# Patient Record
Sex: Female | Born: 1950 | Race: White | Hispanic: No | Marital: Married | Smoking: Never smoker
Health system: Southern US, Community
[De-identification: ages and names within clinical notes are randomized; demographics above are authoritative.]

## PROBLEM LIST (undated history)

## (undated) DIAGNOSIS — E079 Disorder of thyroid, unspecified: Secondary | ICD-10-CM

## (undated) HISTORY — PX: BREAST SURGERY: SHX581

## (undated) HISTORY — DX: Disorder of thyroid, unspecified: E07.9

---

## 1999-05-26 ENCOUNTER — Other Ambulatory Visit: Admission: RE | Admit: 1999-05-26 | Discharge: 1999-05-26 | Payer: Self-pay | Admitting: *Deleted

## 2003-03-20 ENCOUNTER — Ambulatory Visit (HOSPITAL_COMMUNITY): Admission: RE | Admit: 2003-03-20 | Discharge: 2003-03-20 | Payer: Self-pay | Admitting: *Deleted

## 2004-02-12 ENCOUNTER — Other Ambulatory Visit: Admission: RE | Admit: 2004-02-12 | Discharge: 2004-02-12 | Payer: Self-pay | Admitting: *Deleted

## 2004-10-12 ENCOUNTER — Emergency Department (HOSPITAL_COMMUNITY): Admission: EM | Admit: 2004-10-12 | Discharge: 2004-10-12 | Payer: Self-pay | Admitting: Emergency Medicine

## 2005-02-25 ENCOUNTER — Other Ambulatory Visit: Admission: RE | Admit: 2005-02-25 | Discharge: 2005-02-25 | Payer: Self-pay | Admitting: Gynecology

## 2006-03-06 ENCOUNTER — Other Ambulatory Visit: Admission: RE | Admit: 2006-03-06 | Discharge: 2006-03-06 | Payer: Self-pay | Admitting: Obstetrics and Gynecology

## 2006-03-22 ENCOUNTER — Ambulatory Visit (HOSPITAL_COMMUNITY): Admission: RE | Admit: 2006-03-22 | Discharge: 2006-03-22 | Payer: Self-pay | Admitting: Obstetrics and Gynecology

## 2006-06-07 ENCOUNTER — Emergency Department (HOSPITAL_COMMUNITY): Admission: EM | Admit: 2006-06-07 | Discharge: 2006-06-07 | Payer: Self-pay | Admitting: Emergency Medicine

## 2007-03-08 ENCOUNTER — Other Ambulatory Visit: Admission: RE | Admit: 2007-03-08 | Discharge: 2007-03-08 | Payer: Self-pay | Admitting: Obstetrics and Gynecology

## 2007-05-02 ENCOUNTER — Emergency Department (HOSPITAL_COMMUNITY): Admission: EM | Admit: 2007-05-02 | Discharge: 2007-05-02 | Payer: Self-pay | Admitting: *Deleted

## 2007-06-21 HISTORY — PX: EYE SURGERY: SHX253

## 2007-06-22 ENCOUNTER — Ambulatory Visit (HOSPITAL_COMMUNITY): Admission: RE | Admit: 2007-06-22 | Discharge: 2007-06-22 | Payer: Self-pay | Admitting: Obstetrics and Gynecology

## 2008-03-17 ENCOUNTER — Encounter: Payer: Self-pay | Admitting: Obstetrics and Gynecology

## 2008-03-17 ENCOUNTER — Ambulatory Visit: Payer: Self-pay | Admitting: Obstetrics and Gynecology

## 2008-03-17 ENCOUNTER — Other Ambulatory Visit: Admission: RE | Admit: 2008-03-17 | Discharge: 2008-03-17 | Payer: Self-pay | Admitting: Obstetrics and Gynecology

## 2008-04-08 ENCOUNTER — Ambulatory Visit: Payer: Self-pay | Admitting: Obstetrics and Gynecology

## 2008-08-14 IMAGING — CR DG CHEST 1V PORT
1 series · 1 of 1 positions shown · non-contrast
Comparison: None available.

CLINICAL DATA: Chest pressure.  
 PORTABLE CHEST - 1 VIEW 05/02/07 AT 7272 HOURS:

[view not recorded]
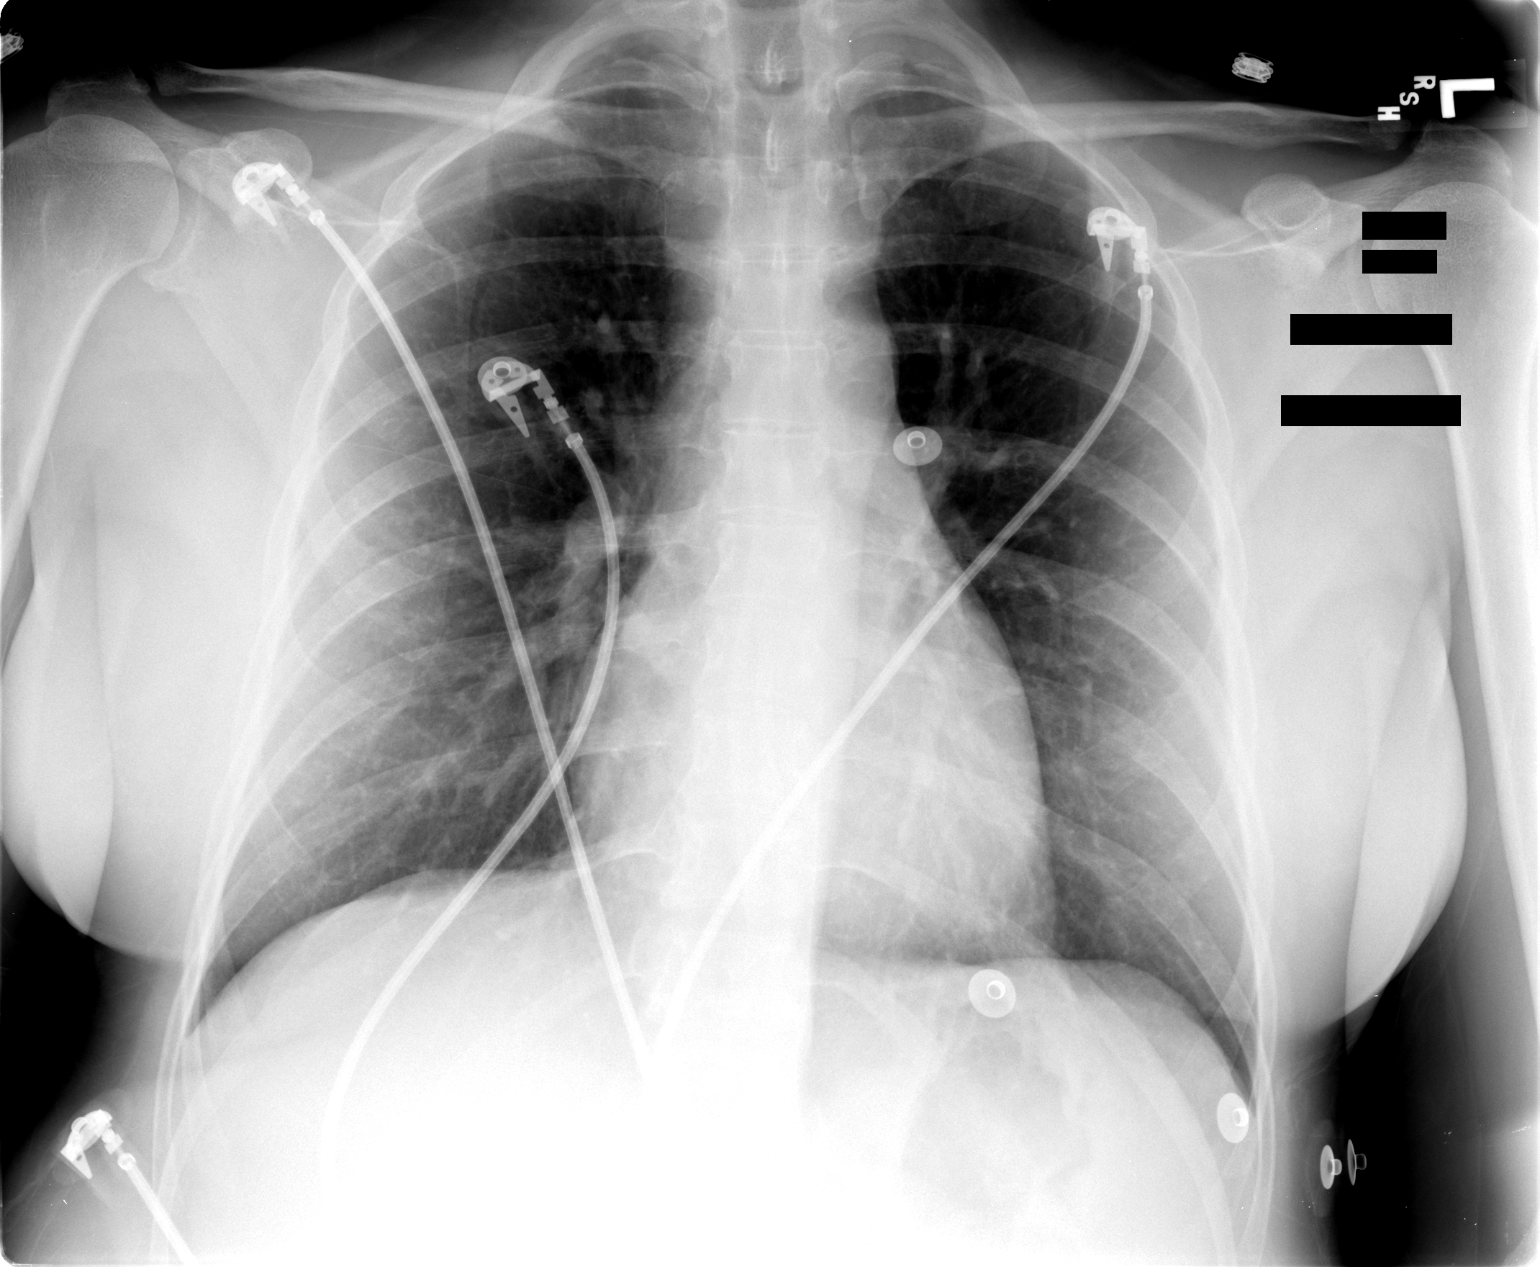

[1 of 1 positions shown; findings below may reference images not displayed]

FINDINGS: The heart size and mediastinal contours are within normal limits.  Both lungs are clear.
IMPRESSION: No acute findings.

## 2008-09-30 ENCOUNTER — Ambulatory Visit (HOSPITAL_COMMUNITY): Admission: RE | Admit: 2008-09-30 | Discharge: 2008-09-30 | Payer: Self-pay | Admitting: Obstetrics and Gynecology

## 2009-03-23 ENCOUNTER — Ambulatory Visit: Payer: Self-pay | Admitting: Obstetrics and Gynecology

## 2009-03-23 ENCOUNTER — Encounter: Payer: Self-pay | Admitting: Obstetrics and Gynecology

## 2009-03-23 ENCOUNTER — Other Ambulatory Visit: Admission: RE | Admit: 2009-03-23 | Discharge: 2009-03-23 | Payer: Self-pay | Admitting: Obstetrics and Gynecology

## 2009-10-01 ENCOUNTER — Ambulatory Visit: Payer: Self-pay | Admitting: Obstetrics and Gynecology

## 2009-10-01 ENCOUNTER — Ambulatory Visit (HOSPITAL_COMMUNITY): Admission: RE | Admit: 2009-10-01 | Discharge: 2009-10-01 | Payer: Self-pay | Admitting: Obstetrics and Gynecology

## 2010-03-30 ENCOUNTER — Ambulatory Visit: Payer: Self-pay | Admitting: Obstetrics and Gynecology

## 2010-03-30 ENCOUNTER — Other Ambulatory Visit: Admission: RE | Admit: 2010-03-30 | Discharge: 2010-03-30 | Payer: Self-pay | Admitting: Obstetrics and Gynecology

## 2010-07-14 ENCOUNTER — Ambulatory Visit
Admission: RE | Admit: 2010-07-14 | Discharge: 2010-07-14 | Payer: Self-pay | Source: Home / Self Care | Attending: Obstetrics and Gynecology | Admitting: Obstetrics and Gynecology

## 2010-09-13 ENCOUNTER — Other Ambulatory Visit: Payer: Self-pay | Admitting: Obstetrics and Gynecology

## 2010-09-13 DIAGNOSIS — Z1231 Encounter for screening mammogram for malignant neoplasm of breast: Secondary | ICD-10-CM

## 2010-10-05 ENCOUNTER — Ambulatory Visit (HOSPITAL_COMMUNITY)
Admission: RE | Admit: 2010-10-05 | Discharge: 2010-10-05 | Disposition: A | Discharge status: Home / Self Care | Payer: BC Managed Care – PPO | Source: Ambulatory Visit | Attending: Obstetrics and Gynecology | Admitting: Obstetrics and Gynecology

## 2010-10-05 DIAGNOSIS — Z1231 Encounter for screening mammogram for malignant neoplasm of breast: Secondary | ICD-10-CM | POA: Insufficient documentation

## 2010-11-05 NOTE — Op Note (Signed)
   NAME:  Shannon Reed, Shannon Reed                 ACCOUNT NO.:  [id number]   MEDICAL RECORD NO.:  [id number]                   PATIENT TYPE:  AMB   LOCATION:  ENDO                                 FACILITY:  [facility]   PHYSICIAN:  James L. Malon Kindle., M.D.          DATE OF BIRTH:  06-Nov-1950   DATE OF PROCEDURE:  03/20/2003  DATE OF DISCHARGE:                                 OPERATIVE REPORT   PROCEDURE:  Colonoscopy.   MEDICATIONS:  Fentanyl 90 mcg, Versed 9 mg IV.   INDICATIONS FOR PROCEDURE:  The patient with a strong family history of  colon cancer. Her father died of colon cancer.   DESCRIPTION OF PROCEDURE:  The procedure had been explained to the patient  and consent obtained.  With the patient in the left lateral decubitus  position, the Olympus pediatric video colonoscope was inserted and advanced.  The prep was excellent.  The cecum was reached with the patient in the  supine position. The ileocecal valve and appendiceal orifice were seen.  The  scope was withdrawn.  The cecum, ascending colon, transverse colon,  descending and sigmoid colon were seen well.  No polyps or other lesions  were seen. The scope was withdrawn.  The patient tolerated the procedure  well.   ASSESSMENT:  No evidence of colon polyps.   PLAN:  Will recommend yearly stool checks and repeat colonoscopy in five  years.                                               James L. Malon Kindle., M.D.    Waldron Session  D:  03/20/2003  T:  03/20/2003  Job:  [id number]   cc:   Al Decant. Janey Greaser, MD  [address]  [place]  [place] [address]  Fax: [phone]

## 2011-03-29 ENCOUNTER — Encounter: Payer: Self-pay | Admitting: Gynecology

## 2011-03-29 DIAGNOSIS — E079 Disorder of thyroid, unspecified: Secondary | ICD-10-CM | POA: Insufficient documentation

## 2011-03-29 LAB — POCT CARDIAC MARKERS
CKMB, poc: <1 — ABNORMAL LOW
CKMB, poc: <1 — ABNORMAL LOW
Myoglobin, poc: 49.4
Myoglobin, poc: 65.2
Operator id: 285841
Troponin i, poc: <0.05

## 2011-03-29 LAB — I-STAT 8, (EC8 V) (CONVERTED LAB)
Bicarbonate: 24
Chloride: 110
HCT: 41
Hemoglobin: 13.9
Operator id: 285841
TCO2: 25
pCO2, Ven: 36.3 — ABNORMAL LOW
pH, Ven: 7.429 — ABNORMAL HIGH

## 2011-03-29 LAB — CBC
HCT: 39.6
Hemoglobin: 13.3
MCHC: 33.5
Platelets: 186
RDW: 13.7

## 2011-03-29 LAB — DIFFERENTIAL
Basophils Absolute: 0
Basophils Relative: 0
Eosinophils Relative: 0
Lymphocytes Relative: 21
Monocytes Absolute: 0.4
Neutro Abs: 4.9

## 2011-03-29 LAB — POCT I-STAT CREATININE: Creatinine, Ser: 0.8

## 2011-04-05 ENCOUNTER — Other Ambulatory Visit (HOSPITAL_COMMUNITY)
Admission: RE | Admit: 2011-04-05 | Discharge: 2011-04-05 | Disposition: A | Discharge status: Home / Self Care | Payer: BC Managed Care – PPO | Source: Ambulatory Visit | Attending: Obstetrics and Gynecology | Admitting: Obstetrics and Gynecology

## 2011-04-05 ENCOUNTER — Ambulatory Visit (INDEPENDENT_AMBULATORY_CARE_PROVIDER_SITE_OTHER): Payer: BC Managed Care – PPO | Admitting: Obstetrics and Gynecology

## 2011-04-05 ENCOUNTER — Encounter: Payer: Self-pay | Admitting: Obstetrics and Gynecology

## 2011-04-05 VITALS — BP 124/74 | Ht 66.0 in | Wt 184.0 lb

## 2011-04-05 DIAGNOSIS — N390 Urinary tract infection, site not specified: Secondary | ICD-10-CM

## 2011-04-05 DIAGNOSIS — Z01419 Encounter for gynecological examination (general) (routine) without abnormal findings: Secondary | ICD-10-CM

## 2011-04-05 DIAGNOSIS — Z833 Family history of diabetes mellitus: Secondary | ICD-10-CM

## 2011-04-05 DIAGNOSIS — R82998 Other abnormal findings in urine: Secondary | ICD-10-CM

## 2011-04-05 DIAGNOSIS — Z13220 Encounter for screening for lipoid disorders: Secondary | ICD-10-CM

## 2011-04-05 DIAGNOSIS — N952 Postmenopausal atrophic vaginitis: Secondary | ICD-10-CM

## 2011-04-05 DIAGNOSIS — E039 Hypothyroidism, unspecified: Secondary | ICD-10-CM

## 2011-04-05 MED ORDER — LEVOTHYROXINE SODIUM 125 MCG PO TABS: 125.0000 ug | ORAL_TABLET | Freq: Every day | ORAL | Status: DC

## 2011-04-05 NOTE — Progress Notes (Signed)
Patient came to see me today for an annual GYN exam. She remains asymptomatic on her Synthroid. She is up-to-date on mammograms and bone densities. She may be due for another colonoscopy. She has some menopausal symptoms including vaginal dryness but feels that she's okay without treatment. She is having no vaginal bleeding or pelvic pain.  ROS: 12 system review negative except as noted above.  Physical examination: Kennon Portela present HEENT within normal limits. Neck: Thyroid not large. No masses. Supraclavicular nodes: not enlarged. Breasts: Examined in both sitting midline position. No skin changes and no masses. Abdomen: Soft no guarding rebound or masses or hernia. Pelvic: External: Within normal limits. BUS: Within normal limits. Vaginal:within normal limits. Good estrogen effect. No evidence of cystocele rectocele or enterocele. Cervix: clean. Uterus: Normal size and shape. Adnexa: No masses. Rectovaginal exam: Confirmatory and negative. Extremities: Within normal limits.  Assessment: #1. Hypothyroidism #2. Mild menopausal symptoms  Plan: Continue Synthroid. TSH checked. Continue yearly mammograms. Patient to check with GI M.D. To see when she is due for colonoscopy. She will tell him that her father died of abdominal cancer of unknown etiology.

## 2011-04-12 ENCOUNTER — Telehealth: Payer: Self-pay

## 2011-04-12 NOTE — Telephone Encounter (Signed)
error 

## 2011-04-13 ENCOUNTER — Other Ambulatory Visit: Payer: Self-pay | Admitting: Obstetrics and Gynecology

## 2011-04-13 DIAGNOSIS — E7800 Pure hypercholesterolemia, unspecified: Secondary | ICD-10-CM

## 2011-04-13 DIAGNOSIS — E785 Hyperlipidemia, unspecified: Secondary | ICD-10-CM

## 2011-04-13 DIAGNOSIS — D696 Thrombocytopenia, unspecified: Secondary | ICD-10-CM

## 2011-04-19 ENCOUNTER — Other Ambulatory Visit: Payer: BC Managed Care – PPO

## 2011-04-26 ENCOUNTER — Other Ambulatory Visit: Payer: BC Managed Care – PPO

## 2011-05-03 ENCOUNTER — Other Ambulatory Visit (INDEPENDENT_AMBULATORY_CARE_PROVIDER_SITE_OTHER): Payer: BC Managed Care – PPO | Admitting: *Deleted

## 2011-05-03 DIAGNOSIS — E7800 Pure hypercholesterolemia, unspecified: Secondary | ICD-10-CM

## 2011-05-03 DIAGNOSIS — D696 Thrombocytopenia, unspecified: Secondary | ICD-10-CM

## 2011-09-08 ENCOUNTER — Other Ambulatory Visit: Payer: Self-pay | Admitting: Obstetrics and Gynecology

## 2011-09-08 DIAGNOSIS — Z1231 Encounter for screening mammogram for malignant neoplasm of breast: Secondary | ICD-10-CM

## 2011-10-06 ENCOUNTER — Ambulatory Visit (HOSPITAL_COMMUNITY)
Admission: RE | Admit: 2011-10-06 | Discharge: 2011-10-06 | Disposition: A | Discharge status: Home / Self Care | Payer: BC Managed Care – PPO | Source: Ambulatory Visit | Attending: Obstetrics and Gynecology | Admitting: Obstetrics and Gynecology

## 2011-10-06 DIAGNOSIS — Z1231 Encounter for screening mammogram for malignant neoplasm of breast: Secondary | ICD-10-CM | POA: Insufficient documentation

## 2011-10-11 ENCOUNTER — Other Ambulatory Visit: Payer: Self-pay | Admitting: *Deleted

## 2011-10-11 DIAGNOSIS — N63 Unspecified lump in unspecified breast: Secondary | ICD-10-CM

## 2011-10-17 ENCOUNTER — Other Ambulatory Visit: Payer: Self-pay | Admitting: Internal Medicine

## 2011-10-18 ENCOUNTER — Ambulatory Visit
Admission: RE | Admit: 2011-10-18 | Discharge: 2011-10-18 | Disposition: A | Discharge status: Home / Self Care | Payer: BC Managed Care – PPO | Source: Ambulatory Visit | Attending: Obstetrics and Gynecology | Admitting: Obstetrics and Gynecology

## 2011-10-18 DIAGNOSIS — N63 Unspecified lump in unspecified breast: Secondary | ICD-10-CM

## 2012-04-05 ENCOUNTER — Encounter: Payer: Self-pay | Admitting: Obstetrics and Gynecology

## 2012-04-05 ENCOUNTER — Ambulatory Visit (INDEPENDENT_AMBULATORY_CARE_PROVIDER_SITE_OTHER): Payer: BC Managed Care – PPO | Admitting: Obstetrics and Gynecology

## 2012-04-05 VITALS — BP 120/76 | Ht 65.5 in | Wt 186.0 lb

## 2012-04-05 DIAGNOSIS — E039 Hypothyroidism, unspecified: Secondary | ICD-10-CM

## 2012-04-05 DIAGNOSIS — Z01419 Encounter for gynecological examination (general) (routine) without abnormal findings: Secondary | ICD-10-CM

## 2012-04-05 LAB — TSH: TSH: 2.097 u[IU]/mL (ref 0.350–4.500)

## 2012-04-05 MED ORDER — LEVOTHYROXINE SODIUM 125 MCG PO TABS: 125.0000 ug | ORAL_TABLET | Freq: Every day | ORAL | Status: DC

## 2012-04-05 NOTE — Patient Instructions (Signed)
Continue yearly mammograms 

## 2012-04-05 NOTE — Progress Notes (Signed)
Patient came to see me today for her annual GYN exam. She is having no vaginal bleeding. She is having no pelvic pain. She does have some vaginal dryness with intercourse but does not need medication. She is doing well without HRT. She was recalled this year after her mammogram but all was okay. She's had 2 normal bone densities. She has always had normal Pap smears. Her last Pap smear was 2012. We are treating her for hypothyroidism. She is asymptomatic on her current Synthroid dose.  Physical examination:Shannon Reed present. HEENT within normal limits. Neck: Thyroid not large. No masses. Supraclavicular nodes: not enlarged. Breasts: Examined in both sitting and lying  position. No skin changes and no masses. Abdomen: Soft no guarding rebound or masses or hernia. Pelvic: External: Within normal limits. BUS: Within normal limits. Vaginal:within normal limits. Fair  estrogen effect. No evidence of cystocele rectocele or enterocele. Cervix: clean. Uterus: Normal size and shape. Adnexa: No masses. Rectovaginal exam: Confirmatory and negative. Extremities: Within normal limits.  Assessment: #1. Hypothyroidism #2. Mild atrophic vaginitis  Plan: TSH checked. Continue Synthroid. Continue yearly mammograms. It has been 10 years since her last colonoscopy. Strongly urged to get a colonoscopy. Pap Not done.The new Pap smear guidelines were discussed with the patient.

## 2012-04-06 LAB — URINALYSIS W MICROSCOPIC + REFLEX CULTURE
Bacteria, UA: NONE SEEN
Casts: NONE SEEN
Glucose, UA: NEGATIVE mg/dL
Hgb urine dipstick: NEGATIVE
Ketones, ur: NEGATIVE mg/dL
pH: 5.5 (ref 5.0–8.0)

## 2012-04-10 NOTE — Progress Notes (Signed)
Blood was not drawn for CBC. Kennon Portela will call patient to return

## 2012-04-11 ENCOUNTER — Encounter: Payer: Self-pay | Admitting: Obstetrics and Gynecology

## 2012-04-16 ENCOUNTER — Other Ambulatory Visit: Payer: BC Managed Care – PPO

## 2012-04-17 LAB — CBC WITH DIFFERENTIAL/PLATELET
Basophils Absolute: 0 10*3/uL (ref 0.0–0.1)
Basophils Relative: 1 % (ref 0–1)
MCHC: 34.2 g/dL (ref 30.0–36.0)
Monocytes Absolute: 0.6 10*3/uL (ref 0.1–1.0)
Neutro Abs: 5.9 10*3/uL (ref 1.7–7.7)
Neutrophils Relative %: 68 % (ref 43–77)
Platelets: 203 10*3/uL (ref 150–400)
RDW: 13.9 % (ref 11.5–15.5)

## 2013-04-16 ENCOUNTER — Encounter: Payer: Self-pay | Admitting: Gynecology

## 2013-04-16 ENCOUNTER — Telehealth: Payer: Self-pay | Admitting: *Deleted

## 2013-04-16 ENCOUNTER — Ambulatory Visit (INDEPENDENT_AMBULATORY_CARE_PROVIDER_SITE_OTHER): Payer: BC Managed Care – PPO | Admitting: Gynecology

## 2013-04-16 VITALS — BP 122/72 | Ht 65.0 in | Wt 172.0 lb

## 2013-04-16 DIAGNOSIS — E039 Hypothyroidism, unspecified: Secondary | ICD-10-CM

## 2013-04-16 DIAGNOSIS — Z01419 Encounter for gynecological examination (general) (routine) without abnormal findings: Secondary | ICD-10-CM

## 2013-04-16 DIAGNOSIS — N952 Postmenopausal atrophic vaginitis: Secondary | ICD-10-CM

## 2013-04-16 LAB — COMPREHENSIVE METABOLIC PANEL
ALT: 8 U/L (ref 0–35)
AST: 16 U/L (ref 0–37)
Albumin: 4.6 g/dL (ref 3.5–5.2)
Alkaline Phosphatase: 131 U/L — ABNORMAL HIGH (ref 39–117)
BUN: 12 mg/dL (ref 6–23)
Calcium: 9.8 mg/dL (ref 8.4–10.5)
Chloride: 104 mEq/L (ref 96–112)
Potassium: 4.2 mEq/L (ref 3.5–5.3)
Total Protein: 7.1 g/dL (ref 6.0–8.3)

## 2013-04-16 LAB — LIPID PANEL
HDL: 69 mg/dL (ref >39)
LDL Cholesterol: 116 mg/dL — ABNORMAL HIGH (ref 0–99)
VLDL: 22 mg/dL (ref 0–40)

## 2013-04-16 LAB — URINALYSIS W MICROSCOPIC + REFLEX CULTURE
Casts: NONE SEEN
Glucose, UA: NEGATIVE mg/dL
Hgb urine dipstick: NEGATIVE
Leukocytes, UA: NEGATIVE
Nitrite: NEGATIVE
RBC / HPF: NONE SEEN RBC/hpf (ref <3)
Specific Gravity, Urine: 1.009 (ref 1.005–1.030)
Squamous Epithelial / LPF: NONE SEEN
WBC, UA: NONE SEEN WBC/hpf (ref <3)
pH: 6 (ref 5.0–8.0)

## 2013-04-16 LAB — CBC WITH DIFFERENTIAL/PLATELET
Basophils Absolute: 0 10*3/uL (ref 0.0–0.1)
Basophils Relative: 0 % (ref 0–1)
Eosinophils Absolute: 0 10*3/uL (ref 0.0–0.7)
Eosinophils Relative: 0 % (ref 0–5)
HCT: 39.7 % (ref 36.0–46.0)
Hemoglobin: 13.5 g/dL (ref 12.0–15.0)
Lymphocytes Relative: 20 % (ref 12–46)
Lymphs Abs: 1.4 10*3/uL (ref 0.7–4.0)
MCH: 29.2 pg (ref 26.0–34.0)
MCHC: 34 g/dL (ref 30.0–36.0)
MCV: 85.9 fL (ref 78.0–100.0)
Monocytes Absolute: 0.5 10*3/uL (ref 0.1–1.0)
Monocytes Relative: 7 % (ref 3–12)
Neutro Abs: 4.9 10*3/uL (ref 1.7–7.7)
Neutrophils Relative %: 73 % (ref 43–77)
Platelets: 175 10*3/uL (ref 150–400)
RBC: 4.62 MIL/uL (ref 3.87–5.11)
RDW: 14.3 % (ref 11.5–15.5)
WBC: 6.8 10*3/uL (ref 4.0–10.5)

## 2013-04-16 LAB — TSH: TSH: 1.891 u[IU]/mL (ref 0.350–4.500)

## 2013-04-16 MED ORDER — LEVOTHYROXINE SODIUM 125 MCG PO TABS: 125.0000 ug | ORAL_TABLET | Freq: Every day | ORAL | Status: AC

## 2013-04-16 NOTE — Telephone Encounter (Signed)
Pharmacy called to clarify if pt takes brand or generic synthroid 125 mcg daily, I called back and left on voicemail that per notes pt take synthroid brand, this is what pt takes in past.

## 2013-04-16 NOTE — Progress Notes (Signed)
Shannon Reed 05-06-1951 [id number]        62 y.o.  [id number] for annual exam.  Former patient of Dr. Eda Paschal. Several issues noted below.  Past medical history,surgical history, medications, allergies, family history and social history were all reviewed and documented in the EPIC chart.  ROS:  Performed and pertinent positives and negatives are included in the history, assessment and plan .  Exam: Kim assistant Filed Vitals:   04/16/13 0855  BP: 122/72  Height: 5\' 5"  (1.651 m)  Weight: 172 lb (78.019 kg)   General appearance  Normal Skin grossly normal Head/Neck normal with no cervical or supraclavicular adenopathy thyroid normal Lungs  clear Cardiac RR, without RMG Abdominal  soft, nontender, without masses, organomegaly or hernia Breasts  examined lying and sitting without masses, retractions, discharge or axillary adenopathy. Pelvic  Ext/BUS/vagina  normal with atrophic changes.  Cervix  normal with atrophic changes  Uterus  anteverted, normal size, shape and contour, midline and mobile nontender   Adnexa  Without masses or tenderness    Anus and perineum  normal   Rectovaginal  normal sphincter tone without palpated masses or tenderness.    Assessment/Plan:  62 y.o. [id number] female for annual exam.   1. Postmenopausal. Patient without significant hot flashes, night sweats, vaginal dryness or dyspareunia. No bleeding. Patient knows to report any bleeding. 2. Hypothyroid. Dr. Eda Paschal prescribes her Synthroid and I checked a TSH today and refilled her Synthroid x1 year. 3. DEXA 2009 normal as was her 2006 DEXA. Plan repeat in several years. Increase calcium and vitamin D reviewed. Check vitamin D level today. 4. Mammography 2013. Recommended scheduling now and she agrees to do so. She did have a call back with her last mammogram due to a shadow and I recommended she get a 3-D mammogram this year. SBE monthly reviewed. 5. Pap smear 2012. No Pap smear done today. No  history of abnormal Pap smears. Plan repeat next year a 3 year interval 6. Colonoscopy 8-9 years ago. Patient's going to call and schedule when due. 7. Health maintenance. Baseline CBC comprehensive metabolic panel lipid profile urinalysis TSH and vitamin D ordered. Followup one year, sooner as needed.  Note: This document was prepared with digital dictation and possible smart phrase technology. Any transcriptional errors that result from this process are unintentional.   Dara Lords MD, 9:31 AM 04/16/2013

## 2013-04-16 NOTE — Patient Instructions (Signed)
Schedule your 3-D mammogram. Followup in one year for annual exam.

## 2013-04-22 ENCOUNTER — Other Ambulatory Visit: Payer: Self-pay | Admitting: Gynecology

## 2013-04-22 DIAGNOSIS — E7800 Pure hypercholesterolemia, unspecified: Secondary | ICD-10-CM

## 2013-04-25 ENCOUNTER — Other Ambulatory Visit: Payer: BC Managed Care – PPO

## 2013-04-25 DIAGNOSIS — E7800 Pure hypercholesterolemia, unspecified: Secondary | ICD-10-CM

## 2013-04-25 LAB — LIPID PANEL
HDL: 70 mg/dL (ref >39)
LDL Cholesterol: 127 mg/dL — ABNORMAL HIGH (ref 0–99)
Total CHOL/HDL Ratio: 3 Ratio
Triglycerides: 78 mg/dL (ref <150)

## 2013-04-25 LAB — ALKALINE PHOSPHATASE: Alkaline Phosphatase: 137 U/L — ABNORMAL HIGH (ref 39–117)

## 2013-05-30 ENCOUNTER — Other Ambulatory Visit: Payer: Self-pay

## 2013-05-30 DIAGNOSIS — Z1231 Encounter for screening mammogram for malignant neoplasm of breast: Secondary | ICD-10-CM

## 2013-07-04 ENCOUNTER — Ambulatory Visit
Admission: RE | Admit: 2013-07-04 | Discharge: 2013-07-04 | Disposition: A | Discharge status: Home / Self Care | Payer: BC Managed Care – PPO | Source: Ambulatory Visit

## 2013-07-04 DIAGNOSIS — Z1231 Encounter for screening mammogram for malignant neoplasm of breast: Secondary | ICD-10-CM

## 2013-07-05 ENCOUNTER — Other Ambulatory Visit: Payer: Self-pay | Admitting: Gynecology

## 2013-07-05 DIAGNOSIS — R928 Other abnormal and inconclusive findings on diagnostic imaging of breast: Secondary | ICD-10-CM

## 2013-07-12 ENCOUNTER — Ambulatory Visit
Admission: RE | Admit: 2013-07-12 | Discharge: 2013-07-12 | Disposition: A | Discharge status: Home / Self Care | Payer: BC Managed Care – PPO | Source: Ambulatory Visit | Attending: Gynecology | Admitting: Gynecology

## 2013-07-12 ENCOUNTER — Other Ambulatory Visit: Payer: Self-pay | Admitting: Gynecology

## 2013-07-12 DIAGNOSIS — R928 Other abnormal and inconclusive findings on diagnostic imaging of breast: Secondary | ICD-10-CM

## 2013-07-12 DIAGNOSIS — N6320 Unspecified lump in the left breast, unspecified quadrant: Secondary | ICD-10-CM

## 2013-07-15 ENCOUNTER — Ambulatory Visit
Admission: RE | Admit: 2013-07-15 | Discharge: 2013-07-15 | Disposition: A | Discharge status: Home / Self Care | Payer: BC Managed Care – PPO | Source: Ambulatory Visit | Attending: Gynecology | Admitting: Gynecology

## 2013-07-15 ENCOUNTER — Other Ambulatory Visit: Payer: Self-pay | Admitting: Gynecology

## 2013-07-15 DIAGNOSIS — N6320 Unspecified lump in the left breast, unspecified quadrant: Secondary | ICD-10-CM

## 2013-07-17 ENCOUNTER — Other Ambulatory Visit: Payer: Self-pay | Admitting: Gynecology

## 2013-07-17 ENCOUNTER — Telehealth: Payer: Self-pay

## 2013-07-17 DIAGNOSIS — C50919 Malignant neoplasm of unspecified site of unspecified female breast: Secondary | ICD-10-CM

## 2013-07-17 NOTE — Telephone Encounter (Signed)
Patient called stating she has been diagnosed with breast cancer.  She wants to go to either [facility] or [facility] and wants the best doctor, one of the chiefs or heads.  She needs you to refer her there and advice regarding which one to go to and perhaps a doctor name.    I spoke with Juliann Pulse at [facility]. 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 She only found out this morning.   Patient will be at home awaiting our return call [phone].

## 2013-07-17 NOTE — Telephone Encounter (Signed)
I called the patient in followup. I reviewed with her that I felt that [place] had a very good team of surgeons, medical oncologist and radiation oncologist to treat breast cancer. I had the utmost confidence in them. I have no specific connections with [facility] or [facility]. I would be happy to refer her to these centers but had no specific physician to send her to. She is already scheduled in the breast cancer conference at [facility] by the radiology department and has this appointment scheduled for the first week of February. She is now comfortable with seeing them and will keep that appointment and followup with them for ongoing evaluation and treatment. I encouraged her if she has any questions or concerns/confusion during her treatment to call me.

## 2013-07-19 ENCOUNTER — Other Ambulatory Visit: Payer: Self-pay | Admitting: *Deleted

## 2013-07-19 ENCOUNTER — Telehealth: Payer: Self-pay | Admitting: *Deleted

## 2013-07-19 ENCOUNTER — Telehealth: Payer: Self-pay

## 2013-07-19 DIAGNOSIS — C50919 Malignant neoplasm of unspecified site of unspecified female breast: Secondary | ICD-10-CM

## 2013-07-19 DIAGNOSIS — C50212 Malignant neoplasm of upper-inner quadrant of left female breast: Secondary | ICD-10-CM

## 2013-07-19 NOTE — Telephone Encounter (Signed)
What I would suggest is to have her visit here at [facility] and then if she is still interested in a second opinion option 1 is to ask the oncologist she sees who they would recommend at [facility] for a second opinion or option 2 call us and we will send her to see an oncologist at [facility].

## 2013-07-19 NOTE — Telephone Encounter (Signed)
After speaking with patient about the below note she states there has been some miscommunication. Pt would like to have her MRI of breast here and still see a oncology here if possible to see what this MD would recommend along with a second opinion from [place]. Pt has MRI of breast schedule on 07/23/13 but canceled this, I did replace order in epic to be reschedule.

## 2013-07-19 NOTE — Telephone Encounter (Signed)
I was prepreparing to get prior authorization for patient's breast MRI scheduled 07/23/13 at 10:00am.  However, when I looked in the system it had been cancelled.  I called [place] and all they could offer was that the patient cancelled the appt.  I called and left message for her to call me just so I can see what patient's plans are.

## 2013-07-19 NOTE — Telephone Encounter (Signed)
Confirmed [facility] for 07/24/13 at 12N .  Instructions and contact information given. 

## 2013-07-19 NOTE — Telephone Encounter (Signed)
Check with [facility] oncology clinic and asked them who they refer to for second opinion breast cancer at [facility]. Tell patient the we are checking with them for referral. Remind her that I do not have any special people that I use and this is going to be their recommendation.

## 2013-07-19 NOTE — Telephone Encounter (Signed)
Pt spoke with you 07/19/13 regarding being diagnosed with breast cancer. She called back today stating she would prefer to referred to [facility] for 2nd opinion and asked if you would do this. Please advise

## 2013-07-22 ENCOUNTER — Telehealth: Payer: Self-pay | Admitting: *Deleted

## 2013-07-22 NOTE — Telephone Encounter (Signed)
Pt called to r/s her [facility] appt.  Confirmed new appt date and time of 07/31/13 at 1200.

## 2013-07-23 ENCOUNTER — Other Ambulatory Visit: Payer: BC Managed Care – PPO

## 2013-07-24 ENCOUNTER — Telehealth: Payer: Self-pay

## 2013-07-24 ENCOUNTER — Ambulatory Visit: Payer: BC Managed Care – PPO

## 2013-07-24 ENCOUNTER — Other Ambulatory Visit: Payer: BC Managed Care – PPO

## 2013-07-24 ENCOUNTER — Ambulatory Visit: Payer: BC Managed Care – PPO | Admitting: Physical Therapy

## 2013-07-24 ENCOUNTER — Telehealth: Payer: Self-pay | Admitting: *Deleted

## 2013-07-24 ENCOUNTER — Ambulatory Visit: Payer: BC Managed Care – PPO | Admitting: Oncology

## 2013-07-24 ENCOUNTER — Ambulatory Visit: Payer: BC Managed Care – PPO | Admitting: Radiation Oncology

## 2013-07-24 NOTE — Telephone Encounter (Signed)
Pt appt with Dr. Humphrey Rolls and [facility] on 07/31/13 is at 0800 not 1200.  Pt is aware.

## 2013-07-24 NOTE — Telephone Encounter (Signed)
We received a call this morning from [facility] saying that patient was there and having MRI and they were trying to authorize it but could not because we had an authorization in place.  That authorization is for MRI patient had [place] schedule for her on Fri 2/6 at [place].  The lady at [facility] said that needs to be cancelled and the auth cancelled so they could do it at [facility].  I called [employer] and cancelled our authorization and also called [facility] and cancelled the appt.

## 2013-07-25 NOTE — Telephone Encounter (Signed)
Pt has everything scheduled her appointments are in epic.

## 2013-07-26 ENCOUNTER — Other Ambulatory Visit: Payer: BC Managed Care – PPO

## 2013-07-31 ENCOUNTER — Other Ambulatory Visit: Payer: BC Managed Care – PPO

## 2013-07-31 ENCOUNTER — Ambulatory Visit: Payer: BC Managed Care – PPO

## 2013-07-31 ENCOUNTER — Ambulatory Visit (INDEPENDENT_AMBULATORY_CARE_PROVIDER_SITE_OTHER): Payer: BC Managed Care – PPO | Admitting: Surgery

## 2013-07-31 ENCOUNTER — Ambulatory Visit
Admission: RE | Admit: 2013-07-31 | Discharge: 2013-07-31 | Disposition: A | Discharge status: Home / Self Care | Payer: BC Managed Care – PPO | Source: Ambulatory Visit | Attending: Radiation Oncology | Admitting: Radiation Oncology

## 2013-07-31 ENCOUNTER — Ambulatory Visit: Payer: BC Managed Care – PPO | Admitting: Physical Therapy

## 2013-07-31 ENCOUNTER — Ambulatory Visit: Payer: BC Managed Care – PPO | Admitting: Oncology

## 2013-12-05 ENCOUNTER — Ambulatory Visit: Payer: BC Managed Care – PPO | Admitting: Internal Medicine

## 2014-04-21 ENCOUNTER — Encounter: Payer: Self-pay | Admitting: Gynecology

## 2016-08-29 ENCOUNTER — Encounter (HOSPITAL_COMMUNITY): Payer: Self-pay | Admitting: *Deleted

## 2016-08-29 ENCOUNTER — Emergency Department (HOSPITAL_COMMUNITY)
Admission: EM | Admit: 2016-08-29 | Discharge: 2016-08-29 | Disposition: A | Discharge status: Home / Self Care | Payer: BC Managed Care – PPO | Attending: Emergency Medicine | Admitting: Emergency Medicine

## 2016-08-29 ENCOUNTER — Emergency Department (HOSPITAL_COMMUNITY): Payer: BC Managed Care – PPO

## 2016-08-29 DIAGNOSIS — Y92009 Unspecified place in unspecified non-institutional (private) residence as the place of occurrence of the external cause: Secondary | ICD-10-CM | POA: Insufficient documentation

## 2016-08-29 DIAGNOSIS — S63282A Dislocation of proximal interphalangeal joint of right middle finger, initial encounter: Secondary | ICD-10-CM | POA: Insufficient documentation

## 2016-08-29 DIAGNOSIS — Y999 Unspecified external cause status: Secondary | ICD-10-CM | POA: Insufficient documentation

## 2016-08-29 DIAGNOSIS — W109XXA Fall (on) (from) unspecified stairs and steps, initial encounter: Secondary | ICD-10-CM | POA: Insufficient documentation

## 2016-08-29 DIAGNOSIS — E039 Hypothyroidism, unspecified: Secondary | ICD-10-CM | POA: Diagnosis not present

## 2016-08-29 DIAGNOSIS — Z853 Personal history of malignant neoplasm of breast: Secondary | ICD-10-CM | POA: Insufficient documentation

## 2016-08-29 DIAGNOSIS — S6991XA Unspecified injury of right wrist, hand and finger(s), initial encounter: Secondary | ICD-10-CM | POA: Diagnosis present

## 2016-08-29 DIAGNOSIS — S63259A Unspecified dislocation of unspecified finger, initial encounter: Secondary | ICD-10-CM

## 2016-08-29 DIAGNOSIS — Y9389 Activity, other specified: Secondary | ICD-10-CM | POA: Diagnosis not present

## 2016-08-29 MED ORDER — LIDOCAINE HCL (PF) 1 % IJ SOLN
10.0000 mL | Freq: Once | INTRAMUSCULAR | Status: AC
  Administered 2016-08-29: 10 mL
  Filled 2016-08-29: qty 10

## 2016-08-29 NOTE — ED Provider Notes (Signed)
[facility] DEPT Provider Note   CSN: [id number] Arrival date & time: 08/29/16  1609     History   Chief Complaint Chief Complaint  Patient presents with  . Finger Injury    HPI Shannon Reed is a 66 y.o. female.  HPI  66 year old female with history of hypothyroidism who presents with a dislocated right middle finger. Patient reports that she was going down the stairs in her home when she slipped and fell down approximately 3-4 stairs. States that she landed on her boattom, but took most of the force with her right hand. Denies hitting her head or loss of consciousness. No other areas of pain and she is not on blood thinners. Denies prior injury to her fingers. Reports immediate pain and deformity in the right middle finger after falling. Reports intact sensation.  Past Medical History:  Diagnosis Date  . Thyroid disease    hypothyroid    Patient Active Problem List   Diagnosis Date Noted  . Breast cancer of upper-inner quadrant of left female breast ([place]) 07/19/2013  . Thyroid disease     Past Surgical History:  Procedure Laterality Date  . BREAST SURGERY     Benign breast lump  . EYE SURGERY  2009   Laser eye surg    OB History    Gravida Para Term Preterm AB Living   3 3 3     3    SAB TAB Ectopic Multiple Live Births                   Home Medications    Prior to Admission medications   Medication Sig Start Date End Date Taking? Authorizing Provider  Calcium Carbonate-Vitamin D (CALCIUM + D PO) Take by mouth 4 (four) times daily.      Historical Provider, MD  levothyroxine (SYNTHROID, LEVOTHROID) 125 MCG tablet Take 1 tablet (125 mcg total) by mouth daily. 04/16/13   Anastasio Auerbach, MD  Multiple Vitamin (MULTIVITAMIN) tablet Take 1 tablet by mouth daily.      Historical Provider, MD    Family History Family History  Problem Relation Age of Onset  . Hypertension Mother   . Osteoporosis Mother   . Cancer Mother     Skin cancer  .  Psoriasis Mother   . Cancer Father     Colon/Liver cancer  . Diabetes Paternal Uncle   . Heart disease Paternal Uncle   . Diabetes Paternal Grandfather   . Kidney failure Maternal Uncle     Social History Social History  Substance Use Topics  . Smoking status: Never Smoker  . Smokeless tobacco: Never Used  . Alcohol use No     Allergies   Sulfa antibiotics   Review of Systems Review of Systems  Constitutional: Negative for fever.  HENT: Negative for facial swelling.   Eyes: Negative for visual disturbance.  Respiratory: Negative for shortness of breath.   Cardiovascular: Negative for chest pain.  Gastrointestinal: Negative for abdominal pain, nausea and vomiting.  Genitourinary: Negative for frequency and pelvic pain.  Musculoskeletal: Positive for arthralgias (R middle finger) and joint swelling (R middle finger). Negative for back pain, gait problem, myalgias, neck pain and neck stiffness.  Skin: Negative for wound.  Neurological: Negative for weakness, numbness and headaches.  Psychiatric/Behavioral: Negative for agitation, behavioral problems and confusion.     Physical Exam Updated Vital Signs BP 124/72   Pulse 72   Temp 98.6 F (37 C)   Resp 16   Ht 5'  5" (1.651 m)   Wt 77.7 kg   SpO2 98%   BMI 28.50 kg/m   Physical Exam  Constitutional: She is oriented to person, place, and time. She appears well-developed and well-nourished. No distress.  HENT:  Head: Normocephalic and atraumatic.  Right Ear: External ear normal.  Left Ear: External ear normal.  Nose: Nose normal.  Eyes: Conjunctivae are normal. Pupils are equal, round, and reactive to light. Right eye exhibits no discharge. Left eye exhibits no discharge.  Neck: Normal range of motion. Neck supple.  No midline TTP over the cervical spine  Cardiovascular: Normal rate, regular rhythm, normal heart sounds and intact distal pulses.  Exam reveals no gallop and no friction rub.   No murmur  heard. Pulmonary/Chest: Effort normal and breath sounds normal. No respiratory distress. She has no wheezes. She has no rales.  Abdominal: Soft. Bowel sounds are normal. She exhibits no distension. There is no tenderness.  Musculoskeletal:  Dorsal dislocation of the R 3rd PIP joint. Intact sensation to light touch. No midline TTP over the T or L spine  Neurological: She is alert and oriented to person, place, and time. She exhibits normal muscle tone.  Intact sensation to light touch in the median, radial, and ulnar nerve distributions of the R hand. Following reduction, pt has full flexion and extension to the DIP, PIP, and MCP joints of the R hand.   Skin: Skin is warm and dry. No rash noted. She is not diaphoretic.  Psychiatric: She has a normal mood and affect. Her behavior is normal. Judgment and thought content normal.     ED Treatments / Results  Labs (all labs ordered are listed, but only abnormal results are displayed) Labs Reviewed - No data to display  EKG  EKG Interpretation None       Radiology Dg Finger Middle Right  Result Date: 08/29/2016 CLINICAL DATA:  Fall with deformity EXAM: RIGHT MIDDLE FINGER 2+V COMPARISON:  None. FINDINGS: Dorsal dislocation at the third PIP joint.  No gross fracture. IMPRESSION: 1. Dorsal dislocation of the third middle phalanx with respect to the head of the third proximal phalanx. No obvious fracture. Electronically Signed   By: Donavan Foil M.D.   On: 08/29/2016 17:00    Procedures .Nerve Block Date/Time: 08/29/2016 10:41 PM Performed by: Zipporah Plants Authorized by: Veryl Speak   Consent:    Consent obtained:  Verbal Indications:    Indications:  Pain relief and procedural anesthesia Location:    Body area:  Upper extremity   Upper extremity nerve blocked: digital ring block, R middle finger.   Laterality:  Right Pre-procedure details:    Skin preparation:  2% chlorhexidine Procedure details (see MAR for exact  dosages):    Block needle gauge:  27 G   Anesthetic injected:  Lidocaine 1% w/o epi   Injection procedure:  Anatomic landmarks identified, incremental injection and negative aspiration for blood   Paresthesia:  None Post-procedure details:    Outcome:  Anesthesia achieved   Patient tolerance of procedure:  Tolerated well, no immediate complications Reduction of dislocation Date/Time: 08/29/2016 10:43 PM Performed by: Zipporah Plants Authorized by: Veryl Speak  Consent: Verbal consent obtained. Local anesthesia used: yes  Anesthesia: Local anesthesia used: yes Patient tolerance: Patient tolerated the procedure well with no immediate complications Comments: Dislocation of R 3rd middle phalynx.   Marland KitchenSplint Application Date/Time: 5/40/0867 10:44 PM Performed by: Zipporah Plants Authorized by: Veryl Speak   Consent:    Consent  obtained:  Verbal Pre-procedure details:    Sensation:  Normal Procedure details:    Laterality:  Right   Location:  Finger   Finger:  R long finger   Strapping: yes     Splint type:  Finger   Supplies:  Aluminum splint Post-procedure details:    Sensation:  Normal   Patient tolerance of procedure:  Tolerated well, no immediate complications   (including critical care time)  Medications Ordered in ED Medications  lidocaine (PF) (XYLOCAINE) 1 % injection 10 mL (10 mLs Other Given 08/29/16 1702)     Initial Impression / Assessment and Plan / ED Course  I have reviewed the triage vital signs and the nursing notes.  Pertinent labs & imaging results that were available during my care of the patient were reviewed by me and considered in my medical decision making (see chart for details).     Patient has a dorsal dislocation of the right third middle phalanx with no obvious fracture. Ring block performed and then dislocation reduction performed according to the procedure note above. Finger was then splinted in extension. She continues to  have good capillary refill and sensation in that finger. Patient asked to keep the splint in place for 48 hours and then  begin range of motion exercises with that finger. Phone number given for hand surgery should she continue to have pain and swelling. She was asked to apply ice, elevate the extremity, and take Tylenol and motrin for her pain. No other traumatic injuries on exam that necessitate further imaging. Patient discharged in stable condition.  Care of patient overseen by my attending, Dr. Stark Jock.    Final Clinical Impressions(s) / ED Diagnoses   Final diagnoses:  Dislocation of finger, initial encounter    New Prescriptions Discharge Medication List as of 08/29/2016  5:59 PM       Jori Thrall Algernon Huxley, MD 08/29/16 2694    Veryl Speak, MD 08/30/16 [id number]

## 2016-08-29 NOTE — ED Triage Notes (Signed)
The pt fell earlier today stgricking her rt middle finger  It appears to be dislocated

## 2016-08-29 NOTE — ED Notes (Signed)
Pt stable, ambulatory, states understanding of discharge instructions 

## 2016-08-29 NOTE — ED Notes (Signed)
Patient transported to X-ray 

## 2019-08-11 ENCOUNTER — Ambulatory Visit: Payer: Medicare PPO | Attending: Internal Medicine

## 2019-08-11 DIAGNOSIS — Z23 Encounter for immunization: Secondary | ICD-10-CM | POA: Insufficient documentation

## 2019-08-11 NOTE — Progress Notes (Signed)
   Covid-19 Vaccination Clinic  Name:  Shannon Reed    MRN: [id number] DOB: [birth year]  08/11/2019  [name] was observed post Covid-19 immunization for 15 minutes without incidence. She was provided with Vaccine Information Sheet and instruction to access the V-Safe system.   [name] was instructed to call 911 with any severe reactions post vaccine: Marland Kitchen Difficulty breathing  . Swelling of your face and throat  . A fast heartbeat  . A bad rash all over your body  . Dizziness and weakness    Immunizations Administered    Name Date Dose VIS Date Route   Pfizer COVID-19 Vaccine 08/11/2019  8:32 AM 0.3 mL 05/31/2019 Intramuscular   Manufacturer: [place]   Lot: [id number]   [place]: [id number]

## 2019-09-04 ENCOUNTER — Ambulatory Visit: Payer: Medicare PPO | Attending: Internal Medicine

## 2019-09-04 DIAGNOSIS — Z23 Encounter for immunization: Secondary | ICD-10-CM

## 2019-09-04 NOTE — Progress Notes (Signed)
   Covid-19 Vaccination Clinic  Name:  Shannon Reed    MRN: [id number] DOB: 04-19-1951  09/04/2019  Shannon Reed was observed post Covid-19 immunization for 15 minutes without incident. She was provided with Vaccine Information Sheet and instruction to access the V-Safe system.   Shannon Reed was instructed to call 911 with any severe reactions post vaccine: Marland Kitchen Difficulty breathing  . Swelling of face and throat  . A fast heartbeat  . A bad rash all over body  . Dizziness and weakness   Immunizations Administered    Name Date Dose VIS Date Route   Pfizer COVID-19 Vaccine 09/04/2019  8:25 AM 0.3 mL 05/31/2019 Intramuscular   Manufacturer: [place]   Lot: [id number]   [place]: [id number]

## 2020-04-14 DIAGNOSIS — H524 Presbyopia: Secondary | ICD-10-CM | POA: Diagnosis not present

## 2020-04-14 DIAGNOSIS — H2513 Age-related nuclear cataract, bilateral: Secondary | ICD-10-CM | POA: Diagnosis not present

## 2020-04-14 DIAGNOSIS — H25043 Posterior subcapsular polar age-related cataract, bilateral: Secondary | ICD-10-CM | POA: Diagnosis not present

## 2020-04-14 DIAGNOSIS — H43813 Vitreous degeneration, bilateral: Secondary | ICD-10-CM | POA: Diagnosis not present

## 2020-04-18 ENCOUNTER — Ambulatory Visit: Payer: Medicare PPO | Attending: Internal Medicine

## 2020-04-18 ENCOUNTER — Other Ambulatory Visit: Payer: Self-pay

## 2020-04-18 DIAGNOSIS — Z23 Encounter for immunization: Secondary | ICD-10-CM

## 2020-04-18 NOTE — Progress Notes (Signed)
   Covid-19 Vaccination Clinic  Name:  Shannon Reed    MRN: [id number] DOB: [birth year]  04/18/2020  Shannon Reed was observed post Covid-19 immunization for 15 minutes without incident. She was provided with Vaccine Information Sheet and instruction to access the V-Safe system.   Shannon Reed was instructed to call 911 with any severe reactions post vaccine: Marland Kitchen Difficulty breathing  . Swelling of face and throat  . A fast heartbeat  . A bad rash all over body  . Dizziness and weakness

## 2020-05-29 DIAGNOSIS — Z01419 Encounter for gynecological examination (general) (routine) without abnormal findings: Secondary | ICD-10-CM | POA: Diagnosis not present

## 2020-06-02 DIAGNOSIS — L03012 Cellulitis of left finger: Secondary | ICD-10-CM | POA: Diagnosis not present

## 2020-09-21 DIAGNOSIS — Z1211 Encounter for screening for malignant neoplasm of colon: Secondary | ICD-10-CM | POA: Diagnosis not present

## 2020-09-21 DIAGNOSIS — Z0000 Encounter for general adult medical examination without abnormal findings: Secondary | ICD-10-CM | POA: Diagnosis not present

## 2020-09-21 DIAGNOSIS — Z1389 Encounter for screening for other disorder: Secondary | ICD-10-CM | POA: Diagnosis not present

## 2020-09-24 DIAGNOSIS — Z1231 Encounter for screening mammogram for malignant neoplasm of breast: Secondary | ICD-10-CM | POA: Diagnosis not present

## 2020-09-24 DIAGNOSIS — C50912 Malignant neoplasm of unspecified site of left female breast: Secondary | ICD-10-CM | POA: Diagnosis not present

## 2020-09-24 DIAGNOSIS — C50012 Malignant neoplasm of nipple and areola, left female breast: Secondary | ICD-10-CM | POA: Diagnosis not present

## 2020-09-24 DIAGNOSIS — Z170 Estrogen receptor positive status [ER+]: Secondary | ICD-10-CM | POA: Diagnosis not present

## 2020-09-29 DIAGNOSIS — E7800 Pure hypercholesterolemia, unspecified: Secondary | ICD-10-CM | POA: Diagnosis not present

## 2020-09-29 DIAGNOSIS — E039 Hypothyroidism, unspecified: Secondary | ICD-10-CM | POA: Diagnosis not present

## 2020-10-16 DIAGNOSIS — E7800 Pure hypercholesterolemia, unspecified: Secondary | ICD-10-CM | POA: Diagnosis not present

## 2020-10-16 DIAGNOSIS — E039 Hypothyroidism, unspecified: Secondary | ICD-10-CM | POA: Diagnosis not present

## 2021-03-22 DIAGNOSIS — E039 Hypothyroidism, unspecified: Secondary | ICD-10-CM | POA: Diagnosis not present

## 2021-03-22 DIAGNOSIS — E7800 Pure hypercholesterolemia, unspecified: Secondary | ICD-10-CM | POA: Diagnosis not present

## 2021-03-23 DIAGNOSIS — Z170 Estrogen receptor positive status [ER+]: Secondary | ICD-10-CM | POA: Diagnosis not present

## 2021-03-23 DIAGNOSIS — Z853 Personal history of malignant neoplasm of breast: Secondary | ICD-10-CM | POA: Diagnosis not present

## 2021-03-23 DIAGNOSIS — Z9189 Other specified personal risk factors, not elsewhere classified: Secondary | ICD-10-CM | POA: Diagnosis not present

## 2021-03-23 DIAGNOSIS — M8580 Other specified disorders of bone density and structure, unspecified site: Secondary | ICD-10-CM | POA: Diagnosis not present

## 2021-03-23 DIAGNOSIS — C50012 Malignant neoplasm of nipple and areola, left female breast: Secondary | ICD-10-CM | POA: Diagnosis not present

## 2021-05-03 DIAGNOSIS — H5213 Myopia, bilateral: Secondary | ICD-10-CM | POA: Diagnosis not present

## 2021-05-03 DIAGNOSIS — H25043 Posterior subcapsular polar age-related cataract, bilateral: Secondary | ICD-10-CM | POA: Diagnosis not present

## 2021-05-03 DIAGNOSIS — H2513 Age-related nuclear cataract, bilateral: Secondary | ICD-10-CM | POA: Diagnosis not present

## 2021-05-03 DIAGNOSIS — H43813 Vitreous degeneration, bilateral: Secondary | ICD-10-CM | POA: Diagnosis not present

## 2021-07-13 DIAGNOSIS — Z01419 Encounter for gynecological examination (general) (routine) without abnormal findings: Secondary | ICD-10-CM | POA: Diagnosis not present

## 2021-08-05 DIAGNOSIS — H2512 Age-related nuclear cataract, left eye: Secondary | ICD-10-CM | POA: Diagnosis not present

## 2021-08-05 DIAGNOSIS — H52222 Regular astigmatism, left eye: Secondary | ICD-10-CM | POA: Diagnosis not present

## 2021-08-05 DIAGNOSIS — H25042 Posterior subcapsular polar age-related cataract, left eye: Secondary | ICD-10-CM | POA: Diagnosis not present

## 2021-08-05 DIAGNOSIS — H25812 Combined forms of age-related cataract, left eye: Secondary | ICD-10-CM | POA: Diagnosis not present

## 2021-10-07 DIAGNOSIS — Z853 Personal history of malignant neoplasm of breast: Secondary | ICD-10-CM | POA: Diagnosis not present

## 2021-10-07 DIAGNOSIS — C50912 Malignant neoplasm of unspecified site of left female breast: Secondary | ICD-10-CM | POA: Diagnosis not present

## 2021-10-07 DIAGNOSIS — Z170 Estrogen receptor positive status [ER+]: Secondary | ICD-10-CM | POA: Diagnosis not present

## 2021-10-07 DIAGNOSIS — Z1231 Encounter for screening mammogram for malignant neoplasm of breast: Secondary | ICD-10-CM | POA: Diagnosis not present

## 2021-10-07 DIAGNOSIS — C50012 Malignant neoplasm of nipple and areola, left female breast: Secondary | ICD-10-CM | POA: Diagnosis not present

## 2021-10-11 DIAGNOSIS — E7800 Pure hypercholesterolemia, unspecified: Secondary | ICD-10-CM | POA: Diagnosis not present

## 2021-10-11 DIAGNOSIS — Z0000 Encounter for general adult medical examination without abnormal findings: Secondary | ICD-10-CM | POA: Diagnosis not present

## 2021-10-11 DIAGNOSIS — E039 Hypothyroidism, unspecified: Secondary | ICD-10-CM | POA: Diagnosis not present

## 2021-10-11 DIAGNOSIS — Z1211 Encounter for screening for malignant neoplasm of colon: Secondary | ICD-10-CM | POA: Diagnosis not present

## 2021-10-11 DIAGNOSIS — Z1389 Encounter for screening for other disorder: Secondary | ICD-10-CM | POA: Diagnosis not present

## 2021-10-11 DIAGNOSIS — Z23 Encounter for immunization: Secondary | ICD-10-CM | POA: Diagnosis not present

## 2021-10-11 DIAGNOSIS — Z853 Personal history of malignant neoplasm of breast: Secondary | ICD-10-CM | POA: Diagnosis not present

## 2021-10-15 DIAGNOSIS — Z1211 Encounter for screening for malignant neoplasm of colon: Secondary | ICD-10-CM | POA: Diagnosis not present

## 2021-12-30 ENCOUNTER — Encounter (HOSPITAL_COMMUNITY): Payer: Self-pay

## 2021-12-30 ENCOUNTER — Emergency Department (HOSPITAL_COMMUNITY)
Admission: EM | Admit: 2021-12-30 | Discharge: 2021-12-31 | Disposition: A | Discharge status: Home / Self Care | Payer: Medicare PPO | Attending: Emergency Medicine | Admitting: Emergency Medicine

## 2021-12-30 ENCOUNTER — Emergency Department (HOSPITAL_COMMUNITY): Payer: Medicare PPO

## 2021-12-30 DIAGNOSIS — R0789 Other chest pain: Secondary | ICD-10-CM | POA: Diagnosis present

## 2021-12-30 LAB — BASIC METABOLIC PANEL
Anion gap: 8 (ref 5–15)
BUN: 25 mg/dL — ABNORMAL HIGH (ref 8–23)
CO2: 24 mmol/L (ref 22–32)
Calcium: 9.8 mg/dL (ref 8.9–10.3)
Chloride: 108 mmol/L (ref 98–111)
Creatinine, Ser: 0.84 mg/dL (ref 0.44–1.00)
GFR, Estimated: >60 mL/min (ref >60)
Glucose, Bld: 108 mg/dL — ABNORMAL HIGH (ref 70–99)
Potassium: 3.8 mmol/L (ref 3.5–5.1)
Sodium: 140 mmol/L (ref 135–145)

## 2021-12-30 LAB — CBC
HCT: 40.6 % (ref 36.0–46.0)
Hemoglobin: 13.1 g/dL (ref 12.0–15.0)
MCH: 28.9 pg (ref 26.0–34.0)
MCHC: 32.3 g/dL (ref 30.0–36.0)
MCV: 89.6 fL (ref 80.0–100.0)
Platelets: 158 10*3/uL (ref 150–400)
RBC: 4.53 MIL/uL (ref 3.87–5.11)
RDW: 13.3 % (ref 11.5–15.5)
WBC: 6.6 10*3/uL (ref 4.0–10.5)
nRBC: 0 % (ref 0.0–0.2)

## 2021-12-30 LAB — TROPONIN I (HIGH SENSITIVITY): Troponin I (High Sensitivity): 4 ng/L (ref <18)

## 2021-12-30 NOTE — ED Triage Notes (Signed)
Pt reports that she is having CP on and off since 4pm, central, denies SOB or any other cardiac symptoms.

## 2021-12-30 NOTE — ED Provider Triage Note (Signed)
Emergency Medicine Provider Triage Evaluation Note  Shannon Reed , Reed 71 y.o. female  was evaluated in triage.  Pt complains of intermittent central, 8/10 chest pain onset 4 PM today.  No meds tried prior to arrival.  Denies shortness of breath or nausea.  Denies past medical history of MI, cardiac aspiration, stents, diabetes, hypertension.  Review of Systems  Positive: As per HPI Negative:   Physical Exam  BP (!) 151/72   Pulse 79   Temp 98.2 F (36.8 C) (Oral)   Resp 15   SpO2 99%  Gen:   Awake, no distress   Resp:  Normal effort  MSK:   Moves extremities without difficulty  Other:  No chest wall tenderness to palpation  Medical Decision Making  Medically screening exam initiated at 9:36 PM.  Appropriate orders placed.  Shannon Reed was informed that the remainder of the evaluation will be completed by another provider, this initial triage assessment does not replace that evaluation, and the importance of remaining in the ED until their evaluation is complete.  Work-up initiated   Shannon Yale A, PA-C 12/30/21 2139

## 2021-12-31 ENCOUNTER — Other Ambulatory Visit: Payer: Self-pay

## 2021-12-31 LAB — TROPONIN I (HIGH SENSITIVITY): Troponin I (High Sensitivity): 4 ng/L (ref <18)

## 2021-12-31 MED ORDER — SUCRALFATE 1 G PO TABS: 1.0000 g | ORAL_TABLET | Freq: Three times a day (TID) | ORAL | 0 refills | Status: AC

## 2021-12-31 MED ORDER — FAMOTIDINE 20 MG PO TABS: 20.0000 mg | ORAL_TABLET | Freq: Two times a day (BID) | ORAL | 0 refills | Status: AC

## 2021-12-31 MED ORDER — IBUPROFEN 400 MG PO TABS: 400.0000 mg | ORAL_TABLET | Freq: Three times a day (TID) | ORAL | 0 refills | Status: AC

## 2021-12-31 NOTE — ED Provider Notes (Signed)
[facility] EMERGENCY DEPARTMENT Provider Note   CSN: [id number] Arrival date & time: 12/30/21  2121     History  Chief Complaint  Patient presents with   Chest Pain    Shannon Reed is a 71 y.o. female.  HPI Patient presents with her husband who assists with the history.  She presents with concern for transient episodes of chest tightness in the upper sternum.  Onset was yesterday, since that time she has had multiple episodes, occurring without clear precipitant, easing without clear intervention.  Symptoms are always in the same place, with no associated dyspnea, syncope, fever, chills, nausea, vomiting.  She states that she is generally well she has 1 prior cardiac evaluation in the distant past that was reassuring, reportedly. She is a non-smoker, notes that she is currently restoring a vintage house, and yesterday was cleaning a chimney, scrubbing using her pectoralis muscles, and shoulders.  Since that time she has had symptoms, none prior.    Home Medications Prior to Admission medications   Medication Sig Start Date End Date Taking? Authorizing Provider  famotidine (PEPCID) 20 MG tablet Take 1 tablet (20 mg total) by mouth 2 (two) times daily. 12/31/21  Yes Carmin Muskrat, MD  ibuprofen (ADVIL) 400 MG tablet Take 1 tablet (400 mg total) by mouth 3 (three) times daily for 3 days. Take one tablet three times daily for three days 12/31/21 01/03/22 Yes Carmin Muskrat, MD  sucralfate (CARAFATE) 1 g tablet Take 1 tablet (1 g total) by mouth 4 (four) times daily -  with meals and at bedtime. 12/31/21  Yes Carmin Muskrat, MD  Calcium Carbonate-Vitamin D (CALCIUM + D PO) Take by mouth 4 (four) times daily.      [provider]  levothyroxine (SYNTHROID, LEVOTHROID) 125 MCG tablet Take 1 tablet (125 mcg total) by mouth daily. 04/16/13   Fontaine, Belinda Block, MD  Multiple Vitamin (MULTIVITAMIN) tablet Take 1 tablet by mouth daily.      [provider]      Allergies    Sulfa antibiotics    Review of Systems   Review of Systems  All other systems reviewed and are negative.   Physical Exam Updated Vital Signs BP 135/71 (BP Location: Right Arm)   Pulse 66   Temp 97.9 F (36.6 C) (Oral)   Resp 16   SpO2 100%  Physical Exam Vitals and nursing note reviewed.  Constitutional:      General: She is not in acute distress.    Appearance: She is well-developed.  HENT:     Head: Normocephalic and atraumatic.  Eyes:     Conjunctiva/sclera: Conjunctivae normal.  Cardiovascular:     Rate and Rhythm: Normal rate and regular rhythm.  Pulmonary:     Effort: Pulmonary effort is normal. No respiratory distress.     Breath sounds: Normal breath sounds. No stridor.  Chest:     Chest wall: No mass, deformity, tenderness or crepitus.  Abdominal:     General: There is no distension.  Skin:    General: Skin is warm and dry.  Neurological:     Mental Status: She is alert and oriented to person, place, and time.     Cranial Nerves: No cranial nerve deficit.  Psychiatric:        Mood and Affect: Mood normal.     ED Results / Procedures / Treatments   Labs (all labs ordered are listed, but only abnormal results are displayed) Labs Reviewed  BASIC  METABOLIC PANEL - Abnormal; Notable for the following components:      Result Value   Glucose, Bld 108 (*)    BUN 25 (*)    All other components within normal limits  CBC  TROPONIN I (HIGH SENSITIVITY)  TROPONIN I (HIGH SENSITIVITY)    EKG EKG Interpretation  Date/Time:  Thursday December 30 2021 21:35:06 EDT Ventricular Rate:  77 PR Interval:  146 QRS Duration: 78 QT Interval:  380 QTC Calculation: 430 R Axis:   11 Text Interpretation: Normal sinus rhythm T wave abnormality Artifact Abnormal ECG Confirmed by Carmin Muskrat [phone]) on 12/31/2021 7:37:54 AM  Radiology DG Chest 1 View  Result Date: 12/30/2021 CLINICAL DATA:  Chest pain. EXAM: CHEST  1 VIEW COMPARISON:   Chest radiograph dated 05/02/2007. FINDINGS: No focal consolidation, pleural effusion, pneumothorax. The cardiac silhouette is within limits. Atherosclerotic calcification of the aortic arch. No acute osseous pathology. Surgical clips over the left chest. IMPRESSION: No active cardiopulmonary disease. Electronically Signed   By: Anner Crete M.D.   On: 12/30/2021 22:10    Procedures Procedures    Medications Ordered in ED Medications - No data to display  ED Course/ Medical Decision Making/ A&P This patient with a Hx of thyroid dysfunction, non-smoker, nondrinker presents to the ED for concern of transient episodes of chest pain, this involves an extensive number of treatment options, and is a complaint that carries with it a high risk of complications and morbidity.    The differential diagnosis includes ACS, dysrhythmia, musculoskeletal or esophageal etiology   Social Determinants of Health:  No Limiting factor  Additional history obtained:  Additional history and/or information obtained from husband at bedside, notable for HPI details included above   After the initial evaluation, orders, including: Labs monitoring ECG x-ray from triage   On repeat evaluation of the patient stayed the same  Lab Tests:  I personally interpreted labs.  The pertinent results include: 2 normal troponin, trace hyperglycemia  Imaging Studies ordered:  I independently visualized and interpreted imaging which showed unremarkable x-ray I agree with the radiologist interpretation   Dispostion / Final MDM:  After consideration of the diagnostic results and the patient's response to treatment, this adult female without cardiac history with 1 prior cardiac evaluation was reassuring according to her and her husband presents with episodic chest pain in the context of recent increase musculoskeletal activity, including scrubbing, using her upper body muscles.  Patient is here for hours, had no  decompensation, S2 normal troponin, ECG with only mild T wave flattening/inversions.  Heart score is 3, and with low risk profile, no other alarming lab findings, physical exam reassuring, with symmetric pulses bilaterally, no dyspnea, clear lung sounds, no neuro complaints, there is low suspicion for atypical ACS, dissection, PE some suspicion for musculoskeletal etiology.  Patient and husband both comfortable with outpatient follow-up.  Final Clinical Impression(s) / ED Diagnoses Final diagnoses:  Atypical chest pain    Rx / DC Orders ED Discharge Orders          Ordered    ibuprofen (ADVIL) 400 MG tablet  3 times daily        12/31/21 0802    sucralfate (CARAFATE) 1 g tablet  3 times daily with meals & bedtime       Note to Pharmacy: Take for one week   12/31/21 0802    famotidine (PEPCID) 20 MG tablet  2 times daily        12/31/21 0802  Carmin Muskrat, MD 12/31/21 [phone]

## 2021-12-31 NOTE — Discharge Instructions (Addendum)
As discussed, your evaluation today has been largely reassuring.  But, it is important that you monitor your condition carefully, and do not hesitate to return to the ED if you develop new, or concerning changes in your condition. ? ?Otherwise, please follow-up with your physician for appropriate ongoing care. ? ?

## 2023-04-14 DIAGNOSIS — E7800 Pure hypercholesterolemia, unspecified: Secondary | ICD-10-CM | POA: Diagnosis not present

## 2023-04-14 DIAGNOSIS — E039 Hypothyroidism, unspecified: Secondary | ICD-10-CM | POA: Diagnosis not present

## 2023-04-14 DIAGNOSIS — E559 Vitamin D deficiency, unspecified: Secondary | ICD-10-CM | POA: Diagnosis not present

## 2023-05-09 DIAGNOSIS — Z853 Personal history of malignant neoplasm of breast: Secondary | ICD-10-CM | POA: Diagnosis not present

## 2023-05-09 DIAGNOSIS — Z923 Personal history of irradiation: Secondary | ICD-10-CM | POA: Diagnosis not present

## 2023-05-09 DIAGNOSIS — M8580 Other specified disorders of bone density and structure, unspecified site: Secondary | ICD-10-CM | POA: Diagnosis not present

## 2023-05-09 DIAGNOSIS — Z23 Encounter for immunization: Secondary | ICD-10-CM | POA: Diagnosis not present

## 2023-05-09 DIAGNOSIS — Z08 Encounter for follow-up examination after completed treatment for malignant neoplasm: Secondary | ICD-10-CM | POA: Diagnosis not present

## 2023-05-09 DIAGNOSIS — Z9889 Other specified postprocedural states: Secondary | ICD-10-CM | POA: Diagnosis not present

## 2023-05-09 DIAGNOSIS — Z79811 Long term (current) use of aromatase inhibitors: Secondary | ICD-10-CM | POA: Diagnosis not present

## 2023-05-09 DIAGNOSIS — Z9221 Personal history of antineoplastic chemotherapy: Secondary | ICD-10-CM | POA: Diagnosis not present

## 2023-06-22 DIAGNOSIS — H02831 Dermatochalasis of right upper eyelid: Secondary | ICD-10-CM | POA: Diagnosis not present

## 2023-06-22 DIAGNOSIS — H26492 Other secondary cataract, left eye: Secondary | ICD-10-CM | POA: Diagnosis not present

## 2023-06-22 DIAGNOSIS — H02834 Dermatochalasis of left upper eyelid: Secondary | ICD-10-CM | POA: Diagnosis not present

## 2023-06-22 DIAGNOSIS — H43812 Vitreous degeneration, left eye: Secondary | ICD-10-CM | POA: Diagnosis not present

## 2023-06-22 DIAGNOSIS — H524 Presbyopia: Secondary | ICD-10-CM | POA: Diagnosis not present

## 2023-06-22 DIAGNOSIS — H5213 Myopia, bilateral: Secondary | ICD-10-CM | POA: Diagnosis not present

## 2023-07-18 DIAGNOSIS — Z01419 Encounter for gynecological examination (general) (routine) without abnormal findings: Secondary | ICD-10-CM | POA: Diagnosis not present

## 2023-09-12 DIAGNOSIS — Z6832 Body mass index (BMI) 32.0-32.9, adult: Secondary | ICD-10-CM | POA: Diagnosis not present

## 2023-09-12 DIAGNOSIS — M25562 Pain in left knee: Secondary | ICD-10-CM | POA: Diagnosis not present

## 2023-09-21 DIAGNOSIS — L821 Other seborrheic keratosis: Secondary | ICD-10-CM | POA: Diagnosis not present

## 2023-09-21 DIAGNOSIS — L820 Inflamed seborrheic keratosis: Secondary | ICD-10-CM | POA: Diagnosis not present

## 2023-09-21 DIAGNOSIS — L814 Other melanin hyperpigmentation: Secondary | ICD-10-CM | POA: Diagnosis not present

## 2023-09-21 DIAGNOSIS — D1801 Hemangioma of skin and subcutaneous tissue: Secondary | ICD-10-CM | POA: Diagnosis not present

## 2023-10-17 DIAGNOSIS — R030 Elevated blood-pressure reading, without diagnosis of hypertension: Secondary | ICD-10-CM | POA: Diagnosis not present

## 2023-10-17 DIAGNOSIS — E039 Hypothyroidism, unspecified: Secondary | ICD-10-CM | POA: Diagnosis not present

## 2023-10-19 DIAGNOSIS — Z853 Personal history of malignant neoplasm of breast: Secondary | ICD-10-CM | POA: Diagnosis not present

## 2023-10-19 DIAGNOSIS — C50912 Malignant neoplasm of unspecified site of left female breast: Secondary | ICD-10-CM | POA: Diagnosis not present

## 2023-10-19 DIAGNOSIS — Z1231 Encounter for screening mammogram for malignant neoplasm of breast: Secondary | ICD-10-CM | POA: Diagnosis not present

## 2023-10-19 DIAGNOSIS — R92323 Mammographic fibroglandular density, bilateral breasts: Secondary | ICD-10-CM | POA: Diagnosis not present

## 2023-10-19 DIAGNOSIS — C50012 Malignant neoplasm of nipple and areola, left female breast: Secondary | ICD-10-CM | POA: Diagnosis not present

## 2023-10-19 DIAGNOSIS — Z170 Estrogen receptor positive status [ER+]: Secondary | ICD-10-CM | POA: Diagnosis not present

## 2024-01-31 DIAGNOSIS — Z6831 Body mass index (BMI) 31.0-31.9, adult: Secondary | ICD-10-CM | POA: Diagnosis not present

## 2024-01-31 DIAGNOSIS — E559 Vitamin D deficiency, unspecified: Secondary | ICD-10-CM | POA: Diagnosis not present

## 2024-01-31 DIAGNOSIS — E039 Hypothyroidism, unspecified: Secondary | ICD-10-CM | POA: Diagnosis not present

## 2024-01-31 DIAGNOSIS — Z0000 Encounter for general adult medical examination without abnormal findings: Secondary | ICD-10-CM | POA: Diagnosis not present

## 2024-01-31 DIAGNOSIS — E7800 Pure hypercholesterolemia, unspecified: Secondary | ICD-10-CM | POA: Diagnosis not present

## 2024-01-31 DIAGNOSIS — Z23 Encounter for immunization: Secondary | ICD-10-CM | POA: Diagnosis not present

## 2024-03-06 DIAGNOSIS — U071 COVID-19: Secondary | ICD-10-CM | POA: Diagnosis not present

## 2024-05-09 DIAGNOSIS — Z08 Encounter for follow-up examination after completed treatment for malignant neoplasm: Secondary | ICD-10-CM | POA: Diagnosis not present

## 2024-05-09 DIAGNOSIS — Z853 Personal history of malignant neoplasm of breast: Secondary | ICD-10-CM | POA: Diagnosis not present

## 2024-05-09 DIAGNOSIS — Z9221 Personal history of antineoplastic chemotherapy: Secondary | ICD-10-CM | POA: Diagnosis not present

## 2024-05-09 DIAGNOSIS — M8580 Other specified disorders of bone density and structure, unspecified site: Secondary | ICD-10-CM | POA: Diagnosis not present

## 2024-05-09 DIAGNOSIS — Z923 Personal history of irradiation: Secondary | ICD-10-CM | POA: Diagnosis not present

## 2024-05-09 DIAGNOSIS — Z9889 Other specified postprocedural states: Secondary | ICD-10-CM | POA: Diagnosis not present

## 2024-05-09 DIAGNOSIS — Z9012 Acquired absence of left breast and nipple: Secondary | ICD-10-CM | POA: Diagnosis not present

## 2024-05-09 DIAGNOSIS — Z23 Encounter for immunization: Secondary | ICD-10-CM | POA: Diagnosis not present
